# Patient Record
Sex: Female | Born: 1970 | Race: White | Hispanic: No | Marital: Married | Smoking: Never smoker
Health system: Southern US, Community
[De-identification: ages and names within clinical notes are randomized; demographics above are authoritative.]

---

## 1997-05-06 ENCOUNTER — Other Ambulatory Visit: Admission: RE | Admit: 1997-05-06 | Discharge: 1997-05-06 | Payer: Self-pay | Admitting: Obstetrics and Gynecology

## 1998-10-22 ENCOUNTER — Inpatient Hospital Stay (HOSPITAL_COMMUNITY): Admission: AD | Admit: 1998-10-22 | Discharge: 1998-10-24 | Payer: Self-pay | Admitting: Obstetrics and Gynecology

## 1998-10-30 ENCOUNTER — Encounter: Admission: RE | Admit: 1998-10-30 | Discharge: 1999-01-28 | Payer: Self-pay | Admitting: Obstetrics and Gynecology

## 1998-11-24 ENCOUNTER — Other Ambulatory Visit: Admission: RE | Admit: 1998-11-24 | Discharge: 1998-11-24 | Payer: Self-pay | Admitting: Obstetrics and Gynecology

## 1999-11-01 ENCOUNTER — Other Ambulatory Visit: Admission: RE | Admit: 1999-11-01 | Discharge: 1999-11-01 | Payer: Self-pay | Admitting: Obstetrics and Gynecology

## 2000-11-06 ENCOUNTER — Other Ambulatory Visit: Admission: RE | Admit: 2000-11-06 | Discharge: 2000-11-06 | Payer: Self-pay | Admitting: Obstetrics and Gynecology

## 2002-04-16 ENCOUNTER — Inpatient Hospital Stay (HOSPITAL_COMMUNITY): Admission: AD | Admit: 2002-04-16 | Discharge: 2002-04-18 | Payer: Self-pay | Admitting: Obstetrics and Gynecology

## 2002-04-30 ENCOUNTER — Encounter: Admission: RE | Admit: 2002-04-30 | Discharge: 2002-05-30 | Payer: Self-pay | Admitting: Obstetrics and Gynecology

## 2002-05-27 ENCOUNTER — Other Ambulatory Visit: Admission: RE | Admit: 2002-05-27 | Discharge: 2002-05-27 | Payer: Self-pay | Admitting: Obstetrics and Gynecology

## 2003-07-21 ENCOUNTER — Other Ambulatory Visit: Admission: RE | Admit: 2003-07-21 | Discharge: 2003-07-21 | Payer: Self-pay | Admitting: Obstetrics and Gynecology

## 2004-07-28 ENCOUNTER — Other Ambulatory Visit: Admission: RE | Admit: 2004-07-28 | Discharge: 2004-07-28 | Payer: Self-pay | Admitting: Obstetrics and Gynecology

## 2005-08-17 ENCOUNTER — Encounter (INDEPENDENT_AMBULATORY_CARE_PROVIDER_SITE_OTHER): Payer: Self-pay | Admitting: Specialist

## 2005-08-17 ENCOUNTER — Encounter: Admission: RE | Admit: 2005-08-17 | Discharge: 2005-08-17 | Payer: Self-pay | Admitting: Obstetrics and Gynecology

## 2006-08-21 ENCOUNTER — Encounter: Admission: RE | Admit: 2006-08-21 | Discharge: 2006-08-21 | Payer: Self-pay | Admitting: Obstetrics and Gynecology

## 2007-07-26 ENCOUNTER — Ambulatory Visit (HOSPITAL_COMMUNITY): Admission: RE | Admit: 2007-07-26 | Discharge: 2007-07-26 | Payer: Self-pay | Admitting: Obstetrics and Gynecology

## 2007-07-26 ENCOUNTER — Encounter (INDEPENDENT_AMBULATORY_CARE_PROVIDER_SITE_OTHER): Payer: Self-pay | Admitting: Obstetrics and Gynecology

## 2010-05-11 ENCOUNTER — Other Ambulatory Visit: Payer: Self-pay | Admitting: Obstetrics and Gynecology

## 2010-05-11 DIAGNOSIS — Z1231 Encounter for screening mammogram for malignant neoplasm of breast: Secondary | ICD-10-CM

## 2010-05-20 ENCOUNTER — Ambulatory Visit: Payer: Self-pay

## 2010-05-25 ENCOUNTER — Ambulatory Visit
Admission: RE | Admit: 2010-05-25 | Discharge: 2010-05-25 | Disposition: A | Discharge status: Home / Self Care | Payer: Managed Care, Other (non HMO) | Source: Ambulatory Visit | Attending: Obstetrics and Gynecology | Admitting: Obstetrics and Gynecology

## 2010-05-25 DIAGNOSIS — Z1231 Encounter for screening mammogram for malignant neoplasm of breast: Secondary | ICD-10-CM

## 2010-05-25 NOTE — Op Note (Signed)
Tiffany Aguirre, Tiffany Aguirre              ACCOUNT NO.:  [id number]   MEDICAL RECORD NO.:  [id number]          PATIENT TYPE:  AMB   LOCATION:  SDC                           FACILITY:  [facility]   PHYSICIAN:  Malva Limes, M.D.    DATE OF BIRTH:  1970-06-20   DATE OF PROCEDURE:  07/26/2007  DATE OF DISCHARGE:                               OPERATIVE REPORT   PREOPERATIVE DIAGNOSIS:  Menorrhagia.   POSTOPERATIVE DIAGNOSIS:  Menorrhagia.   PROCEDURE:  1. Dilation and curettage.  2. NovaSure endometrial ablation.   SURGEON:  Malva Limes, MD   ANESTHESIA:  MAC with paracervical block.   DRAINS:  None.   SPECIMENS:  Endometrial curettings sent to pathology.   COMPLICATIONS:  None.   ESTIMATED BLOOD LOSS:  20 mL.   PROCEDURE:  The patient was taken to the operating room where MAC  anesthesia was administered without complications.  She was placed in  dorsal lithotomy position.  She was prepped and draped in usual fashion  for this procedure.  An exam under anesthesia revealed anteverted uterus  of normal size and shape.  There were no adnexal masses.  A sterile  speculum was placed in the vagina.  Lidocaine 1% of 20 mL was used for  paracervical block.  A single-tooth tenaculum was applied to the  anterior cervical lip.  The uterus was sounded to 8 cm.  The cervix was  then serially dilated to a 29-French.  The endocervical length was 2.5  cm.  At this point, sharp curettage was performed.  The patient had  copious amounts of tissue consistent with a polyp.  This tissue was sent  to the lab.  At that point, the NovaSure device was placed into the  uterine cavity.  The width was 4.4 cm.  The cavitary length 5.5 cm and  at that point, a seal test was performed and passed.  The device was  turned on for 1 minute, total of 133 watts.  Device was then removed.  The patient tolerated the procedure well.  She was taken to recovery  room in stable condition.  Instrument and lap counts were correct  x1.  The patient will be discharged home.  She was to be sent home with  Percocet to take p.r.n.  She will follow up in the office in 4 weeks.           ______________________________  Malva Limes, M.D.    MA/MEDQ  D:  07/26/2007  T:  07/27/2007  Job:  [id number]

## 2010-05-26 ENCOUNTER — Other Ambulatory Visit: Payer: Self-pay | Admitting: Obstetrics and Gynecology

## 2010-05-26 DIAGNOSIS — R928 Other abnormal and inconclusive findings on diagnostic imaging of breast: Secondary | ICD-10-CM

## 2010-05-28 NOTE — Discharge Summary (Signed)
   NAMELURLEAN, Tiffany Aguirre                          ACCOUNT NO.:  [id number]   MEDICAL RECORD NO.:  [id number]                   PATIENT TYPE:  INP   LOCATION:  9113                                 FACILITY:  [facility]   PHYSICIAN:  Miguel Aschoff, M.D.                    DATE OF BIRTH:  [birth year]   DATE OF ADMISSION:  04/16/2002  DATE OF DISCHARGE:  04/18/2002                                 DISCHARGE SUMMARY   FINAL DIAGNOSES:  Vacuum-assisted vaginal delivery of a female infant with  Apgars of 9/9, delivery performed by Malva Limes, M.D.   COMPLICATIONS:  None.   HOSPITAL COURSE:  This 40 year old G 3, P 2-0-0-2 presents at [redacted] weeks  gestation for induction.  The patient had a history of a fourth-degree  laceration from last pregnancy and therefore an induction was being  performed to deliver a smaller baby.  The patient's prenatal course at this  point has been uncomplicated.  The patient did have a negative group B strep  culture performed in the office.  At this point, the patient was admitted  for eversion secondary to a transverse lie and for to start her induction.  The patient was found to have a transverse lie on ultrasound and the patient  has been elected to attempt eversion.  She was given terbutaline subcu and  __________ on the first attempt.  At this point, the patient was observed  and amniotomy was carried out with fluid and Pitocin begun for her  induction.  The patient pushed for about 2-1/2 hours and developed  exhaustion.  At this point, a vacuum extractor was placed at a plus 2  station.  She did deliver an 8 pound 1 ounce female infant with Apgars of  9/9 over a fourth-degree midline tear.  Otherwise, the delivery without  complications.  The patient's postpartum course was benign without  significant fevers.   DISPOSITION:  She was thought ready for discharge on postpartum day #2, sent  home on a regular diet, told to decrease activities, told to continue Peri-  Colace if needed and prenatal vitamins, was given a prescription for  Darvocet-N 100 one to two every 4 hours if needed for pain and was to follow  up in the office in 4 weeks.   DISCHARGE LABORATORY DATA:  On discharge, the patient had a hemoglobin of  10.1, a white blood cell count of 15.9.     Leilani Able, P.A.-C.                Miguel Aschoff, M.D.    MB/MEDQ  D:  05/13/2002  T:  05/13/2002  Job:  [id number]

## 2010-06-01 ENCOUNTER — Ambulatory Visit
Admission: RE | Admit: 2010-06-01 | Discharge: 2010-06-01 | Disposition: A | Discharge status: Home / Self Care | Payer: Managed Care, Other (non HMO) | Source: Ambulatory Visit | Attending: Obstetrics and Gynecology | Admitting: Obstetrics and Gynecology

## 2010-06-01 DIAGNOSIS — R928 Other abnormal and inconclusive findings on diagnostic imaging of breast: Secondary | ICD-10-CM

## 2010-10-08 LAB — PREGNANCY, URINE: Preg Test, Ur: NEGATIVE

## 2010-10-08 LAB — CBC
MCHC: 33.3
MCV: 90
Platelets: 175
WBC: 5.9

## 2010-12-22 ENCOUNTER — Other Ambulatory Visit: Payer: Self-pay | Admitting: Obstetrics and Gynecology

## 2010-12-27 ENCOUNTER — Ambulatory Visit (INDEPENDENT_AMBULATORY_CARE_PROVIDER_SITE_OTHER): Payer: Managed Care, Other (non HMO)

## 2010-12-27 DIAGNOSIS — H6980 Other specified disorders of Eustachian tube, unspecified ear: Secondary | ICD-10-CM

## 2010-12-27 DIAGNOSIS — H9209 Otalgia, unspecified ear: Secondary | ICD-10-CM

## 2011-03-29 ENCOUNTER — Other Ambulatory Visit: Payer: Self-pay | Admitting: Dermatology

## 2011-05-09 ENCOUNTER — Other Ambulatory Visit: Payer: Self-pay | Admitting: Obstetrics and Gynecology

## 2011-05-09 DIAGNOSIS — Z1231 Encounter for screening mammogram for malignant neoplasm of breast: Secondary | ICD-10-CM

## 2011-05-24 ENCOUNTER — Other Ambulatory Visit: Payer: Self-pay | Admitting: Dermatology

## 2011-06-07 ENCOUNTER — Ambulatory Visit: Payer: Managed Care, Other (non HMO)

## 2011-06-15 ENCOUNTER — Ambulatory Visit
Admission: RE | Admit: 2011-06-15 | Discharge: 2011-06-15 | Disposition: A | Discharge status: Home / Self Care | Payer: Commercial Indemnity | Source: Ambulatory Visit | Attending: Obstetrics and Gynecology | Admitting: Obstetrics and Gynecology

## 2011-06-15 DIAGNOSIS — Z1231 Encounter for screening mammogram for malignant neoplasm of breast: Secondary | ICD-10-CM

## 2012-01-09 ENCOUNTER — Other Ambulatory Visit: Payer: Self-pay | Admitting: Obstetrics and Gynecology

## 2012-11-01 ENCOUNTER — Other Ambulatory Visit: Payer: Self-pay

## 2012-11-01 DIAGNOSIS — Z1231 Encounter for screening mammogram for malignant neoplasm of breast: Secondary | ICD-10-CM

## 2012-12-03 ENCOUNTER — Ambulatory Visit
Admission: RE | Admit: 2012-12-03 | Discharge: 2012-12-03 | Disposition: A | Discharge status: Home / Self Care | Payer: Commercial Indemnity | Source: Ambulatory Visit

## 2012-12-03 DIAGNOSIS — Z1231 Encounter for screening mammogram for malignant neoplasm of breast: Secondary | ICD-10-CM

## 2012-12-05 ENCOUNTER — Other Ambulatory Visit: Payer: Self-pay | Admitting: Obstetrics and Gynecology

## 2012-12-05 DIAGNOSIS — R928 Other abnormal and inconclusive findings on diagnostic imaging of breast: Secondary | ICD-10-CM

## 2012-12-11 ENCOUNTER — Other Ambulatory Visit: Payer: Self-pay | Admitting: Obstetrics and Gynecology

## 2012-12-11 ENCOUNTER — Ambulatory Visit
Admission: RE | Admit: 2012-12-11 | Discharge: 2012-12-11 | Disposition: A | Discharge status: Home / Self Care | Payer: Commercial Indemnity | Source: Ambulatory Visit | Attending: Obstetrics and Gynecology | Admitting: Obstetrics and Gynecology

## 2012-12-11 DIAGNOSIS — R928 Other abnormal and inconclusive findings on diagnostic imaging of breast: Secondary | ICD-10-CM

## 2012-12-13 ENCOUNTER — Ambulatory Visit
Admission: RE | Admit: 2012-12-13 | Discharge: 2012-12-13 | Disposition: A | Discharge status: Home / Self Care | Payer: Commercial Indemnity | Source: Ambulatory Visit | Attending: Obstetrics and Gynecology | Admitting: Obstetrics and Gynecology

## 2012-12-13 ENCOUNTER — Other Ambulatory Visit: Payer: Self-pay | Admitting: Obstetrics and Gynecology

## 2012-12-13 DIAGNOSIS — R928 Other abnormal and inconclusive findings on diagnostic imaging of breast: Secondary | ICD-10-CM

## 2013-01-16 ENCOUNTER — Other Ambulatory Visit: Payer: Self-pay | Admitting: Obstetrics and Gynecology

## 2013-03-22 ENCOUNTER — Ambulatory Visit: Payer: Managed Care, Other (non HMO)

## 2013-03-22 ENCOUNTER — Ambulatory Visit (INDEPENDENT_AMBULATORY_CARE_PROVIDER_SITE_OTHER): Payer: Managed Care, Other (non HMO) | Admitting: Family Medicine

## 2013-03-22 VITALS — BP 100/62 | HR 65 | Temp 98.7°F | Resp 16 | Ht 66.0 in | Wt 144.0 lb

## 2013-03-22 DIAGNOSIS — R059 Cough, unspecified: Secondary | ICD-10-CM

## 2013-03-22 DIAGNOSIS — R071 Chest pain on breathing: Secondary | ICD-10-CM

## 2013-03-22 DIAGNOSIS — R0789 Other chest pain: Secondary | ICD-10-CM

## 2013-03-22 DIAGNOSIS — R05 Cough: Secondary | ICD-10-CM

## 2013-03-22 DIAGNOSIS — J310 Chronic rhinitis: Secondary | ICD-10-CM

## 2013-03-22 LAB — POCT CBC
Granulocyte percent: 71.6 %G (ref 37–80)
HCT, POC: 41.9 % (ref 37.7–47.9)
HEMOGLOBIN: 13 g/dL (ref 12.2–16.2)
LYMPH, POC: 1.8 (ref 0.6–3.4)
MCH: 28.8 pg (ref 27–31.2)
MCHC: 31 g/dL — AB (ref 31.8–35.4)
MCV: 93 fL (ref 80–97)
MID (CBC): 0.5 (ref 0–0.9)
MPV: 11.9 fL (ref 0–99.8)
PLATELET COUNT, POC: 228 10*3/uL (ref 142–424)
POC GRANULOCYTE: 5.8 (ref 2–6.9)
POC LYMPH %: 21.7 % (ref 10–50)
POC MID %: 6.7 % (ref 0–12)
RBC: 4.51 M/uL (ref 4.04–5.48)
RDW, POC: 12.9 %
WBC: 8.1 10*3/uL (ref 4.6–10.2)

## 2013-03-22 MED ORDER — BENZONATATE 100 MG PO CAPS: 100.0000 mg | ORAL_CAPSULE | Freq: Three times a day (TID) | ORAL | Status: AC | PRN

## 2013-03-22 MED ORDER — DICLOFENAC SODIUM 75 MG PO TBEC: 75.0000 mg | DELAYED_RELEASE_TABLET | Freq: Two times a day (BID) | ORAL | Status: AC

## 2013-03-22 MED ORDER — TRAMADOL HCL 50 MG PO TABS: 50.0000 mg | ORAL_TABLET | Freq: Four times a day (QID) | ORAL | Status: AC | PRN

## 2013-03-22 MED ORDER — FLUTICASONE PROPIONATE 50 MCG/ACT NA SUSP: 2.0000 | Freq: Every day | NASAL | Status: AC

## 2013-03-22 MED ORDER — HYDROCODONE-HOMATROPINE 5-1.5 MG/5ML PO SYRP: 5.0000 mL | ORAL_SOLUTION | ORAL | Status: AC | PRN

## 2013-03-22 NOTE — Progress Notes (Signed)
Subjective: 43 year old lady who has had a respiratory tract infection for 3 weeks. The first week she may have had a little bit of fever. She has persisted with a cough ever since then. Occasionally will cough up a little bit of purulent phlegm. The he started having right posterior lateral chest wall pain last Sunday. It came on fairly abruptly that day. It hurts her to move or lay on that side, take a deep breath, cough, or touch the area. She is a nonsmoker. She is in good physical shape and stays healthy. She is a runner and tried and could not do it at all. She is not on any regular medications. She's had and endometrial ablation so does menstrate and is not pregnant. She works related to Engineer, [employer] research  Objective: Pleasant healthy-appearing lady who does cough intermittently. She splints her self some when she coughs. Her TMs are normal. Throat was clear. Neck supple without significant nodes. Chest is clear to auscultation. Heart regular. She does have chest wall tenderness in the right lateral rib cage about the ninth or 10th rib level. No bruising or skin abnormality. The maximum pain is in the posterior axillary line.  Assessment: Cough Chest wall pain   Plan: Chest x-ray and ribs CBC  Results for orders placed in visit on 03/22/13  POCT CBC      Result Value Ref Range   WBC 8.1  4.6 - 10.2 K/uL   Lymph, poc 1.8  0.6 - 3.4   POC LYMPH PERCENT 21.7  10 - 50 %L   MID (cbc) 0.5  0 - 0.9   POC MID % 6.7  0 - 12 %M   POC Granulocyte 5.8  2 - 6.9   Granulocyte percent 71.6  37 - 80 %G   RBC 4.51  4.04 - 5.48 M/uL   Hemoglobin 13.0  12.2 - 16.2 g/dL   HCT, POC 40.941.9  81.137.7 - 47.9 %   MCV 93.0  80 - 97 fL   MCH, POC 28.8  27 - 31.2 pg   MCHC 31.0 (*) 31.8 - 35.4 g/dL   RDW, POC 91.412.9     Platelet Count, POC 228  142 - 424 K/uL   MPV 11.9  0 - 99.8 fL   [facility] reading (PRIMARY) by  Dr. Alwyn RenHopper Normal chest x-ray and ribs  . Chest wall pain secondary to respiratory tract  infection and bronchitis. She just has the rhinitis. Seek treatment instructions.

## 2013-03-22 NOTE — Patient Instructions (Signed)
Drink plenty of fluids and get enough rest  Use the fluticasone nose spray 2 sprays each nostril once daily   Take the cough syrup at nighttime or when being sedated wouldn't bother you  Take the cough pills in the daytime as needed  Get the pain medication filled if needed  First try taking the diclofenac one twice daily for pain and inflammation  Return if worse

## 2014-01-20 ENCOUNTER — Other Ambulatory Visit: Payer: Self-pay | Admitting: Obstetrics and Gynecology

## 2014-01-20 ENCOUNTER — Other Ambulatory Visit: Payer: Self-pay

## 2014-01-20 DIAGNOSIS — Z1231 Encounter for screening mammogram for malignant neoplasm of breast: Secondary | ICD-10-CM

## 2014-01-21 LAB — CYTOLOGY - PAP

## 2014-01-28 ENCOUNTER — Ambulatory Visit
Admission: RE | Admit: 2014-01-28 | Discharge: 2014-01-28 | Disposition: A | Discharge status: Home / Self Care | Payer: Managed Care, Other (non HMO) | Source: Ambulatory Visit

## 2014-01-28 DIAGNOSIS — Z1231 Encounter for screening mammogram for malignant neoplasm of breast: Secondary | ICD-10-CM

## 2014-01-29 ENCOUNTER — Other Ambulatory Visit: Payer: Self-pay | Admitting: Obstetrics and Gynecology

## 2014-01-29 DIAGNOSIS — R928 Other abnormal and inconclusive findings on diagnostic imaging of breast: Secondary | ICD-10-CM

## 2014-02-05 ENCOUNTER — Ambulatory Visit
Admission: RE | Admit: 2014-02-05 | Discharge: 2014-02-05 | Disposition: A | Discharge status: Home / Self Care | Payer: Managed Care, Other (non HMO) | Source: Ambulatory Visit | Attending: Obstetrics and Gynecology | Admitting: Obstetrics and Gynecology

## 2014-02-05 DIAGNOSIS — R928 Other abnormal and inconclusive findings on diagnostic imaging of breast: Secondary | ICD-10-CM

## 2014-09-08 ENCOUNTER — Other Ambulatory Visit: Payer: Self-pay | Admitting: Obstetrics and Gynecology

## 2014-09-08 DIAGNOSIS — N6320 Unspecified lump in the left breast, unspecified quadrant: Secondary | ICD-10-CM

## 2014-09-12 ENCOUNTER — Ambulatory Visit
Admission: RE | Admit: 2014-09-12 | Discharge: 2014-09-12 | Disposition: A | Discharge status: Home / Self Care | Payer: Managed Care, Other (non HMO) | Source: Ambulatory Visit | Attending: Obstetrics and Gynecology | Admitting: Obstetrics and Gynecology

## 2014-09-12 DIAGNOSIS — N6320 Unspecified lump in the left breast, unspecified quadrant: Secondary | ICD-10-CM

## 2014-12-24 ENCOUNTER — Other Ambulatory Visit: Payer: Self-pay | Admitting: Obstetrics and Gynecology

## 2014-12-24 DIAGNOSIS — N6320 Unspecified lump in the left breast, unspecified quadrant: Secondary | ICD-10-CM

## 2014-12-28 IMAGING — US US BREAST*L*
1 series · 4 of 4 positions shown · non-contrast
Comparison: With priors.

CLINICAL DATA: Abnormal left screening mammogram.

EXAM:
DIGITAL DIAGNOSTIC  left MAMMOGRAM
ULTRASOUND left BREAST

[Series 1: us breast*left* · 4 of 4 slices shown]
[im 1/4]
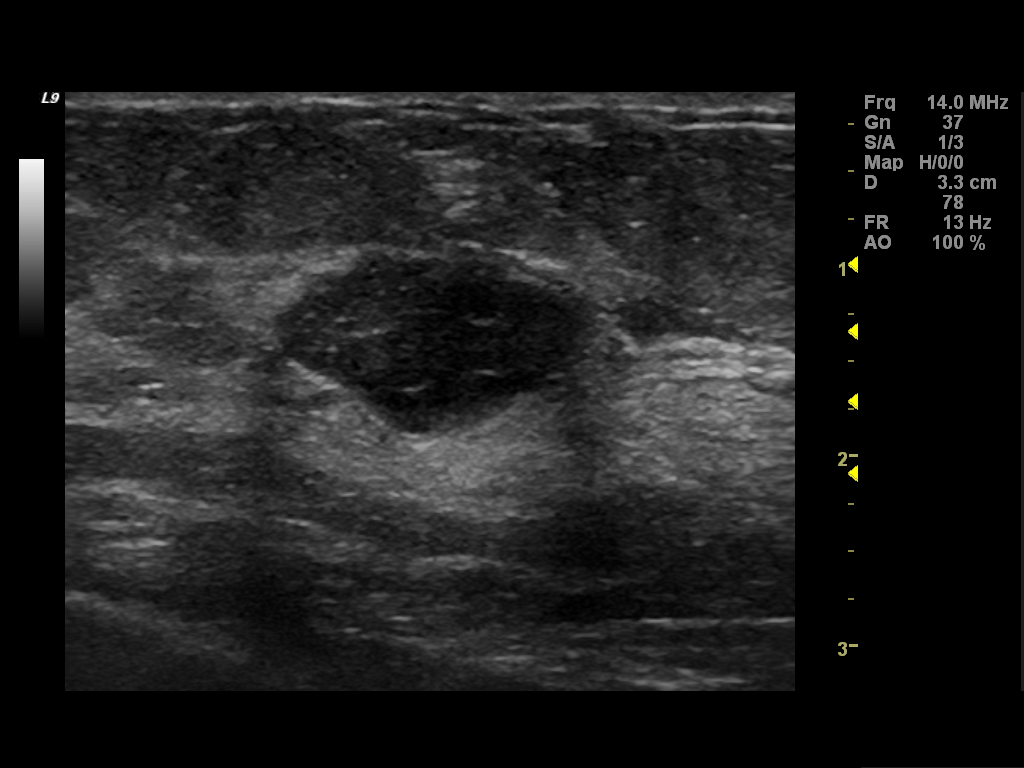
[im 2/4]
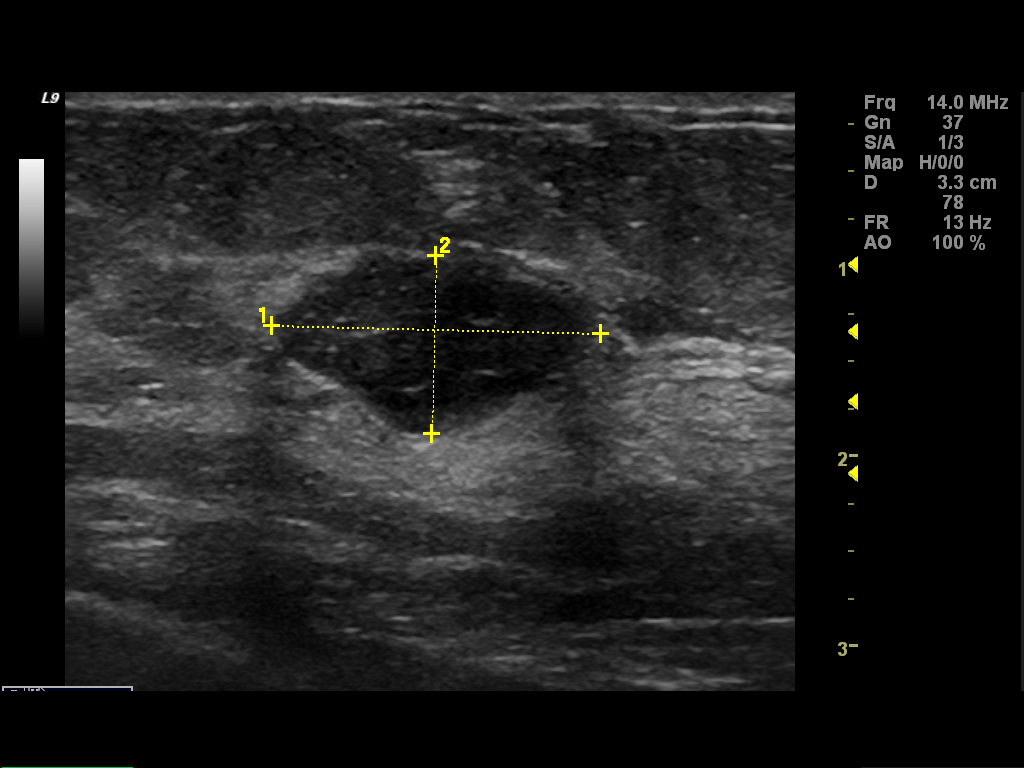
[im 3/4]
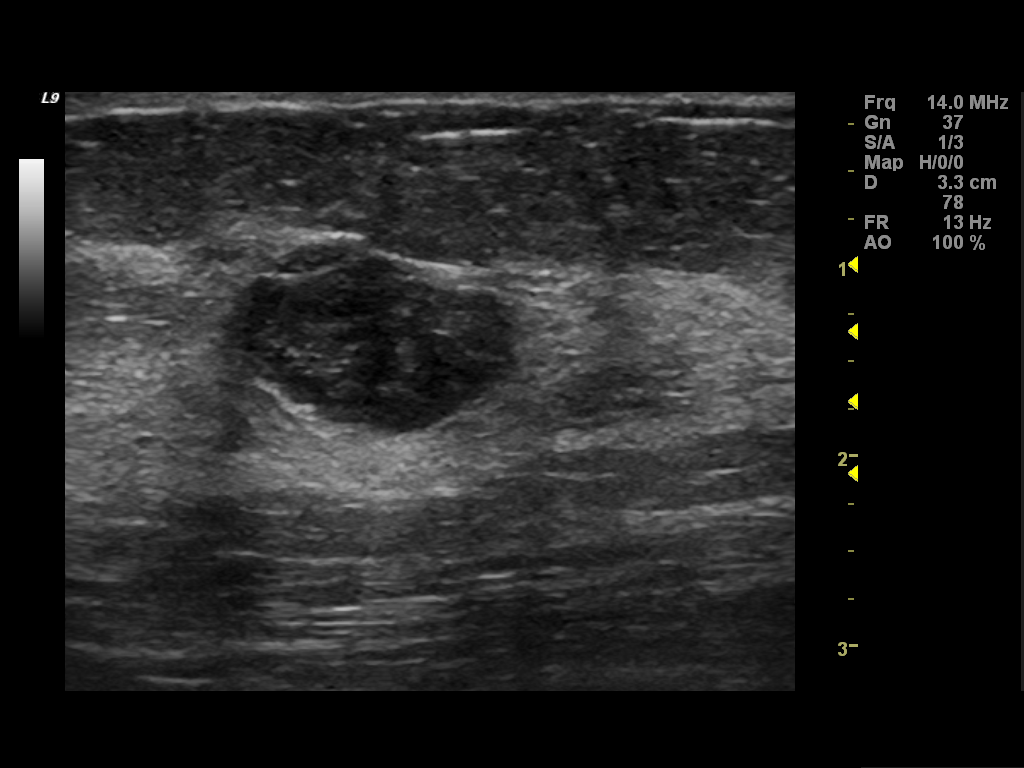
[im 4/4]
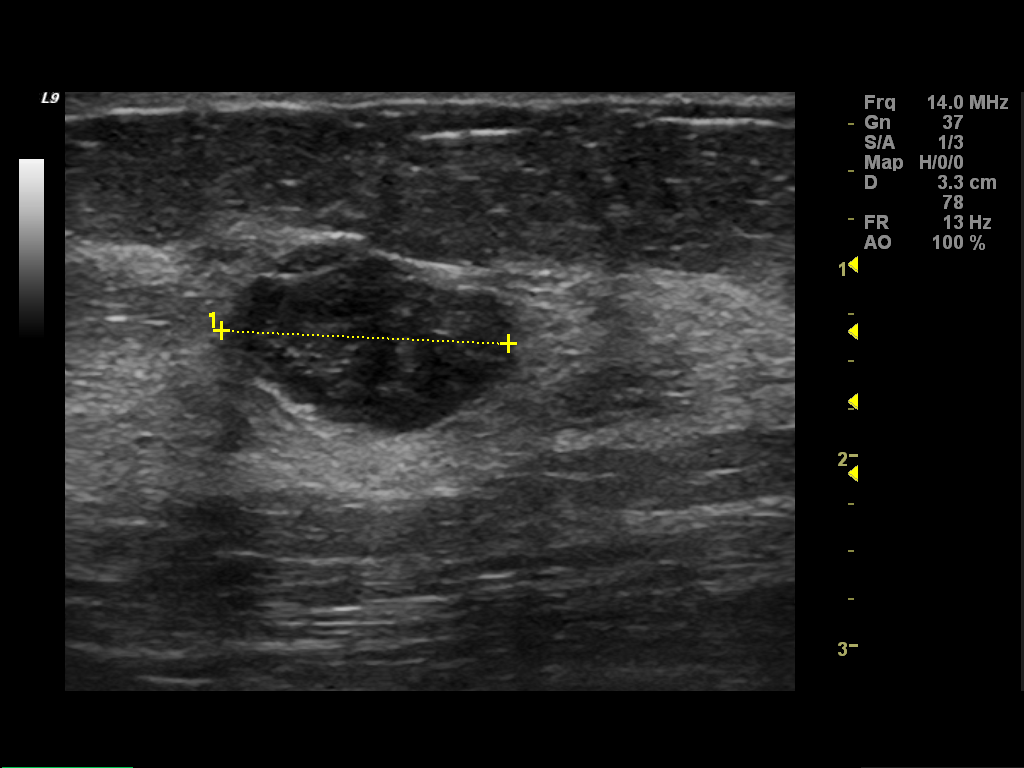

[4 of 4 positions shown; findings below may reference images not displayed]

ACR Breast Density Category c: The breasts are heterogeneously
dense, which may obscure small masses.
FINDINGS: Additional imaging of the left breast shows there is a developing
obscured 2.2 cm mass in the middle 3rd of the 12 o'clock region.

On physical exam I do not palpate a mass in the left breast.

Ultrasound is performed, showing there is a well-circumscribed,
hypoechoic mass in left breast at 12 o'clock 2 cm from the nipple
measuring 1.7 x 0.9 x 1.5 cm. Sonographic evaluation of the left
axilla does not show any enlarged adenopathy.
IMPRESSION: Suspicious left breast mass.  Tissue sampling is recommended.

RECOMMENDATION:
Ultrasound-guided core biopsy of the left breast mass is recommended
and will be scheduled at the patient's convenience.

I have discussed the findings and recommendations with the patient.
Results were also provided in writing at the conclusion of the
visit. If applicable, a reminder letter will be sent to the patient
regarding the next appointment.

BI-RADS CATEGORY  4: Suspicious abnormality - biopsy should be
considered.

## 2014-12-30 IMAGING — MG MM DIGITAL DIAGNOSTIC UNILAT*L*
2 series · 2 of 2 positions shown · non-contrast
Comparison: Previous exams.

ADDENDUM:
Post biopsy clips views were obtained in the medial lateral and
craniocaudad planes and reveal the ribbon shaped clip to be
positioned in the location of the largely obscured mass in the upper
central breast which correlates well with the ultrasound biopsied
circumscribed mass.

PATHOLOGY: Fibroadenoma with fibrocystic change
CONCORDANT:  Yes
I discussed these results and the recommendations below with the
patient by telephone on 12/13/2012 at [DATE] p.m.. All of her
questions were answered. She denies significant pain or bleeding at
the biopsy site.
RECOMMENDATION:  Resume routine screening.
CLINICAL DATA: The patient presents for ultrasound-guided biopsy of
a circumscribed hypoechoic mass in the left breast initially
evaluated on 12/11/2012
EXAM:
ULTRASOUND GUIDED left BREAST CORE NEEDLE BIOPSY WITH VACUUM ASSIST

[L ML]
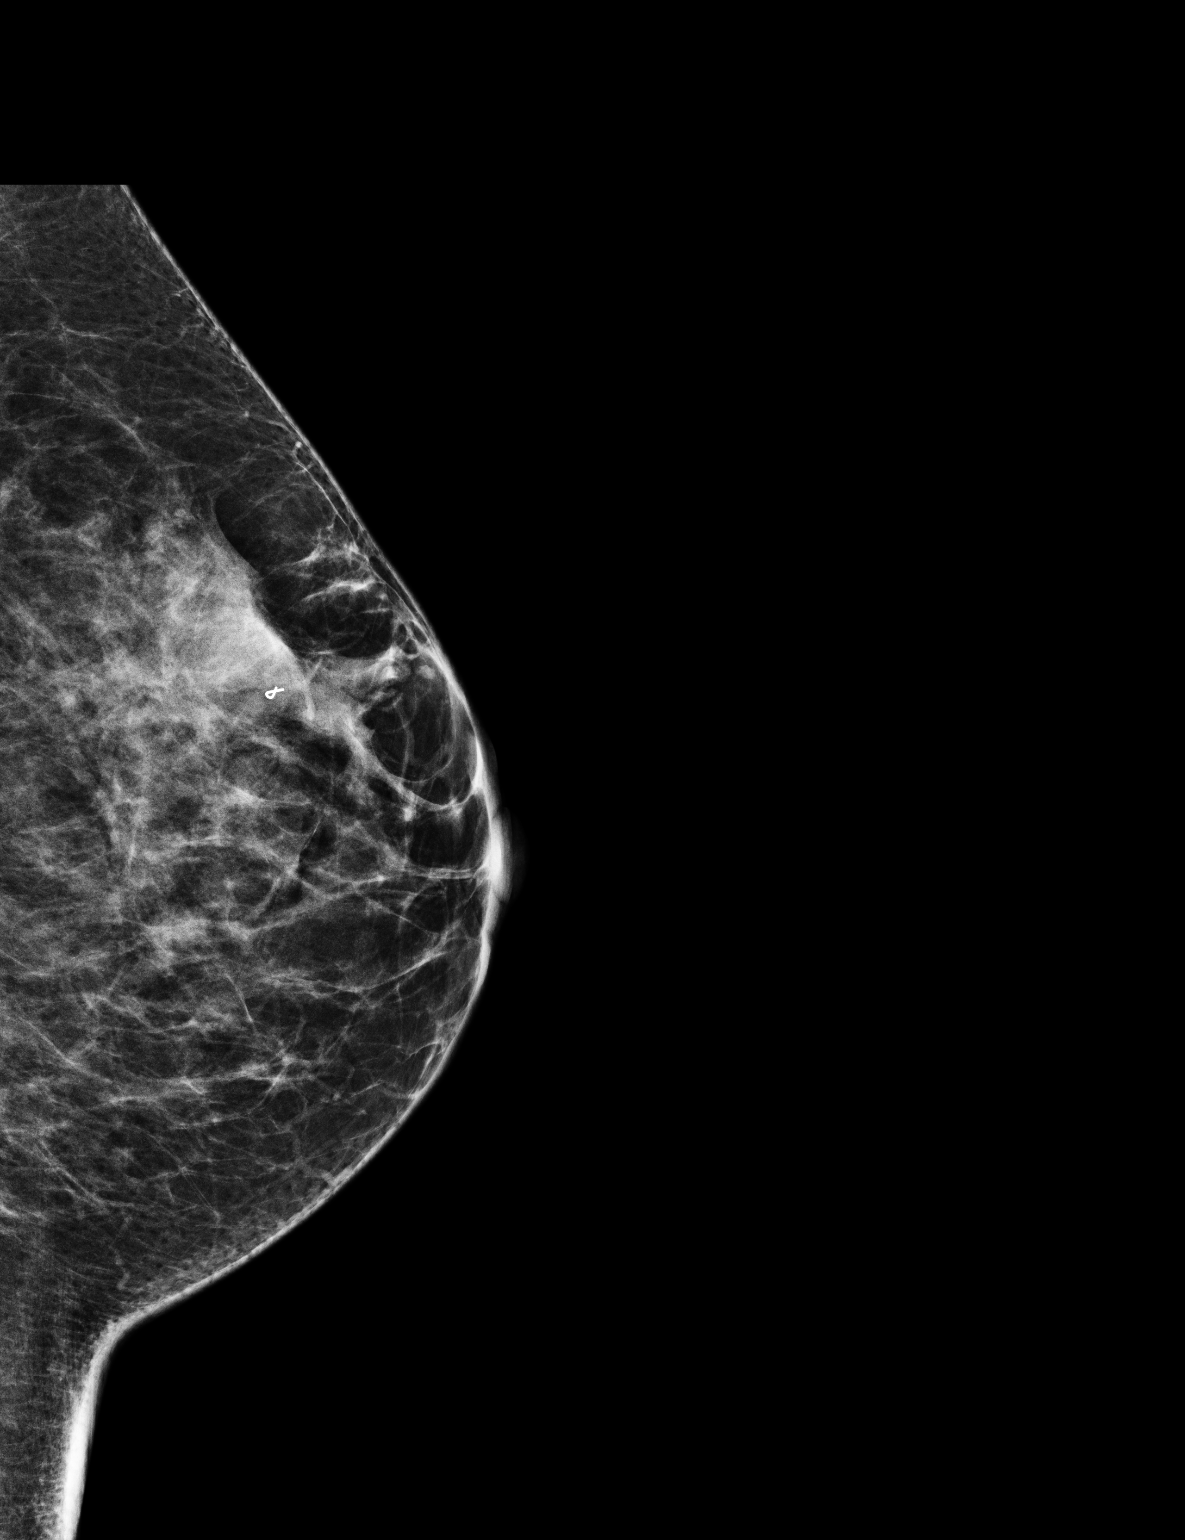

[L CC]
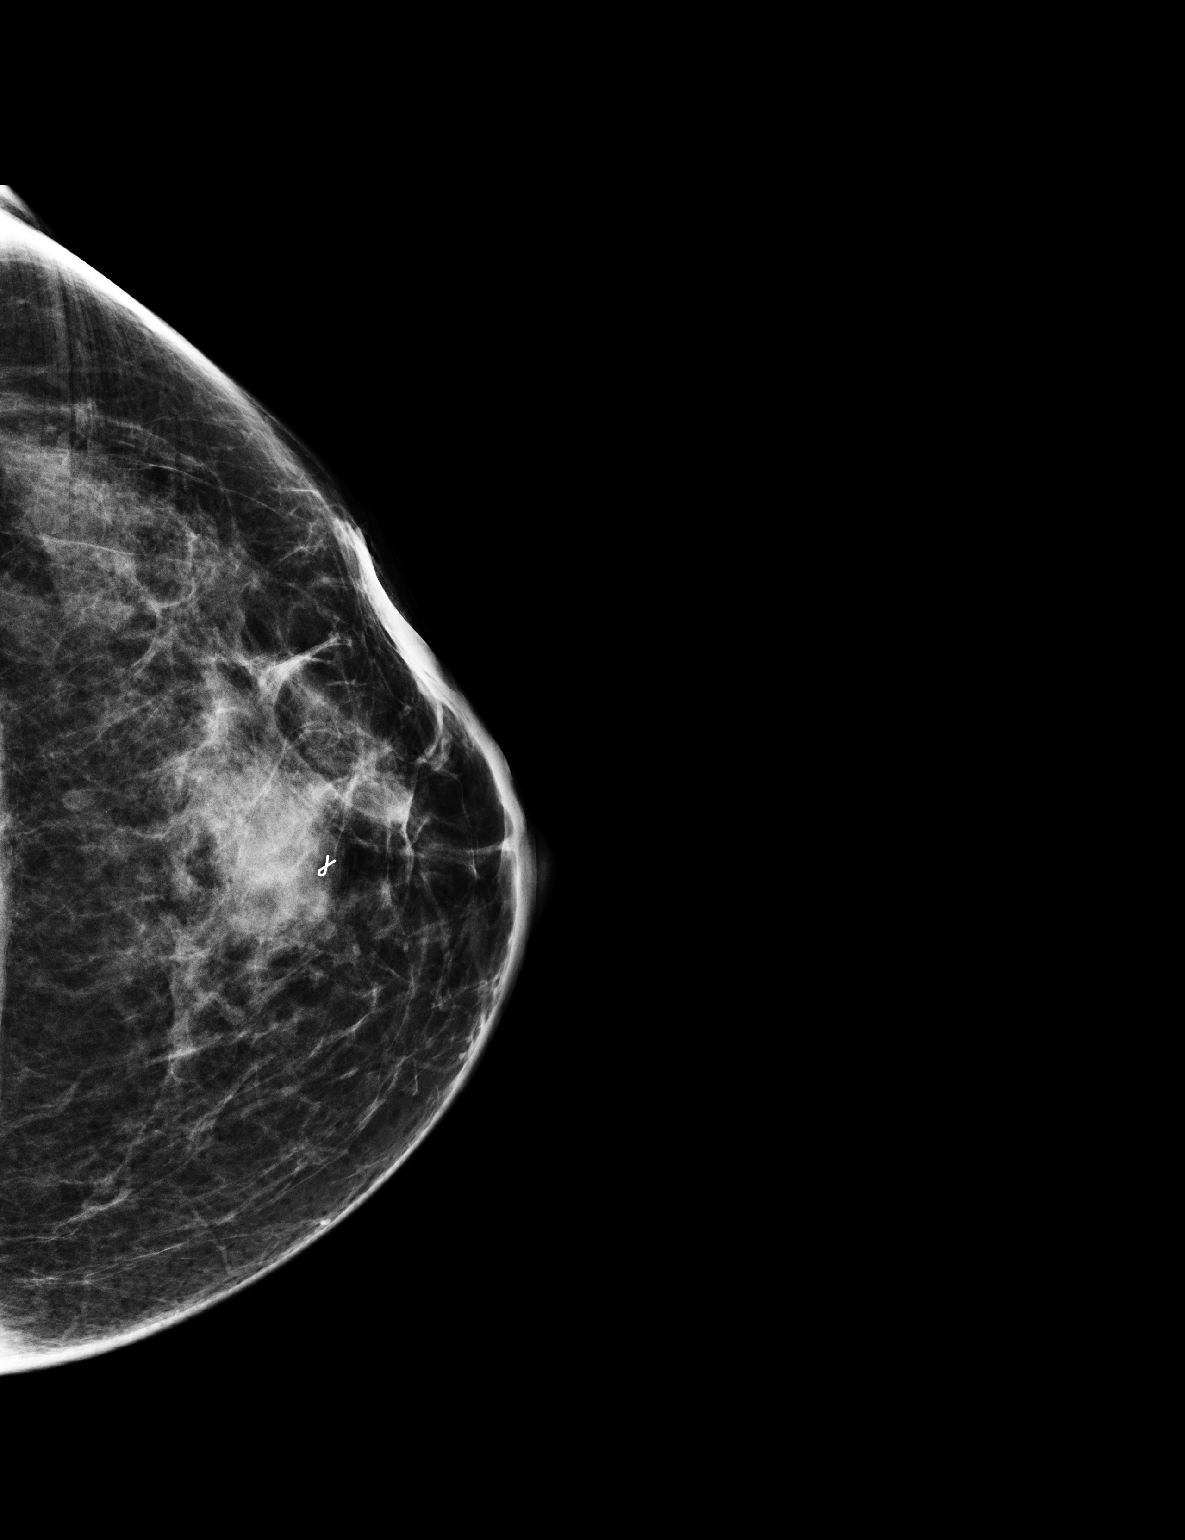

[2 of 2 positions shown; findings below may reference images not displayed]

PROCEDURE:
I met with the patient and we discussed the procedure of
ultrasound-guided biopsy, including benefits and alternatives. We
discussed the high likelihood of a successful procedure. We
discussed the risks of the procedure including infection, bleeding,
tissue injury, clip migration, and inadequate sampling. Informed
written consent was given. The usual time-out protocol was performed
immediately prior to the procedure.

Using sterile technique and 2% Lidocaine as local anesthetic, under
direct ultrasound visualization, a 12 gauge vacuum-assisteddevice
was used to perform biopsy of circumscribed hypoechoic mass in the
left breast positioned at 12 o'clock, 2 cm from the nipple using a
lateral to medial approach. At the conclusion of the procedure, a
ribbon shaped tissue marker clip was deployed into the biopsy
cavity. Follow-up 2-view mammogram was performed and dictated
separately.
IMPRESSION: Ultrasound-guided biopsy of left breast mass as described above. No
apparent complications.

## 2015-01-16 ENCOUNTER — Ambulatory Visit
Admission: RE | Admit: 2015-01-16 | Discharge: 2015-01-16 | Disposition: A | Discharge status: Home / Self Care | Payer: Managed Care, Other (non HMO) | Source: Ambulatory Visit | Attending: Obstetrics and Gynecology | Admitting: Obstetrics and Gynecology

## 2015-01-16 DIAGNOSIS — N6320 Unspecified lump in the left breast, unspecified quadrant: Secondary | ICD-10-CM

## 2015-01-26 ENCOUNTER — Other Ambulatory Visit: Payer: Self-pay | Admitting: Obstetrics and Gynecology

## 2015-01-27 LAB — CYTOLOGY - PAP

## 2015-02-23 ENCOUNTER — Other Ambulatory Visit: Payer: Self-pay | Admitting: Surgery

## 2016-02-23 ENCOUNTER — Other Ambulatory Visit: Payer: Self-pay | Admitting: Obstetrics and Gynecology

## 2016-02-24 LAB — CYTOLOGY - PAP

## 2019-02-27 ENCOUNTER — Other Ambulatory Visit: Payer: Self-pay | Admitting: Internal Medicine

## 2019-02-27 DIAGNOSIS — N644 Mastodynia: Secondary | ICD-10-CM
